# Patient Record
Sex: Female | Born: 1990 | Race: White | Hispanic: No | Marital: Single | Smoking: Never smoker
Health system: Southern US, Community
[De-identification: ages and names within clinical notes are randomized; demographics above are authoritative.]

## PROBLEM LIST (undated history)

## (undated) HISTORY — PX: OTHER SURGICAL HISTORY: SHX169

## (undated) HISTORY — PX: APPENDECTOMY: SHX54

---

## 2013-12-22 ENCOUNTER — Emergency Department (HOSPITAL_BASED_OUTPATIENT_CLINIC_OR_DEPARTMENT_OTHER)
Admission: EM | Admit: 2013-12-22 | Discharge: 2013-12-22 | Disposition: A | Discharge status: Home / Self Care | Payer: BC Managed Care – PPO | Attending: Emergency Medicine | Admitting: Emergency Medicine

## 2013-12-22 ENCOUNTER — Encounter (HOSPITAL_BASED_OUTPATIENT_CLINIC_OR_DEPARTMENT_OTHER): Payer: Self-pay | Admitting: Emergency Medicine

## 2013-12-22 ENCOUNTER — Emergency Department (HOSPITAL_BASED_OUTPATIENT_CLINIC_OR_DEPARTMENT_OTHER): Payer: BC Managed Care – PPO

## 2013-12-22 DIAGNOSIS — J029 Acute pharyngitis, unspecified: Secondary | ICD-10-CM | POA: Insufficient documentation

## 2013-12-22 DIAGNOSIS — Z792 Long term (current) use of antibiotics: Secondary | ICD-10-CM | POA: Insufficient documentation

## 2013-12-22 DIAGNOSIS — K219 Gastro-esophageal reflux disease without esophagitis: Secondary | ICD-10-CM | POA: Insufficient documentation

## 2013-12-22 DIAGNOSIS — Z79899 Other long term (current) drug therapy: Secondary | ICD-10-CM | POA: Insufficient documentation

## 2013-12-22 LAB — CBC WITH DIFFERENTIAL/PLATELET
Basophils Absolute: 0 10*3/uL (ref 0.0–0.1)
Basophils Relative: 0 % (ref 0–1)
EOS ABS: 0.1 10*3/uL (ref 0.0–0.7)
Eosinophils Relative: 2 % (ref 0–5)
HCT: 42.6 % (ref 36.0–46.0)
HEMOGLOBIN: 14.4 g/dL (ref 12.0–15.0)
Lymphocytes Relative: 20 % (ref 12–46)
Lymphs Abs: 1 10*3/uL (ref 0.7–4.0)
MCH: 29.8 pg (ref 26.0–34.0)
MCHC: 33.8 g/dL (ref 30.0–36.0)
MCV: 88.2 fL (ref 78.0–100.0)
MONO ABS: 0.3 10*3/uL (ref 0.1–1.0)
Monocytes Relative: 6 % (ref 3–12)
Neutro Abs: 3.6 10*3/uL (ref 1.7–7.7)
Neutrophils Relative %: 72 % (ref 43–77)
PLATELETS: 327 10*3/uL (ref 150–400)
RBC: 4.83 MIL/uL (ref 3.87–5.11)
RDW: 13.1 % (ref 11.5–15.5)
WBC: 5.1 10*3/uL (ref 4.0–10.5)

## 2013-12-22 LAB — COMPREHENSIVE METABOLIC PANEL
ALT: 17 U/L (ref 0–35)
ANION GAP: 11 (ref 5–15)
AST: 22 U/L (ref 0–37)
Albumin: 3.9 g/dL (ref 3.5–5.2)
Alkaline Phosphatase: 38 U/L — ABNORMAL LOW (ref 39–117)
BUN: 12 mg/dL (ref 6–23)
CO2: 26 mEq/L (ref 19–32)
CREATININE: 0.8 mg/dL (ref 0.50–1.10)
Calcium: 9.6 mg/dL (ref 8.4–10.5)
Chloride: 103 mEq/L (ref 96–112)
GFR calc Af Amer: >90 mL/min (ref >90)
Glucose, Bld: 87 mg/dL (ref 70–99)
Potassium: 4.2 mEq/L (ref 3.7–5.3)
Sodium: 140 mEq/L (ref 137–147)
TOTAL PROTEIN: 6.7 g/dL (ref 6.0–8.3)
Total Bilirubin: 2.3 mg/dL — ABNORMAL HIGH (ref 0.3–1.2)

## 2013-12-22 LAB — LIPASE, BLOOD: Lipase: 39 U/L (ref 11–59)

## 2013-12-22 MED ORDER — GI COCKTAIL ~~LOC~~
30.0000 mL | Freq: Once | ORAL | Status: AC
  Administered 2013-12-22: 30 mL via ORAL
  Filled 2013-12-22: qty 30

## 2013-12-22 NOTE — ED Notes (Signed)
Pt c/o a gurgling sensation in her throat "as if vomit is sitting there". Pt also c/o heartburn. Pt denies difficulty swallowing.

## 2013-12-22 NOTE — ED Provider Notes (Signed)
Medical screening examination/treatment/procedure(s) were performed by non-physician practitioner and as supervising physician I was immediately available for consultation/collaboration.     Rebbecca Osuna, MD 12/22/13 1503 

## 2013-12-22 NOTE — ED Provider Notes (Signed)
CSN: [id number]     Arrival date & time 12/22/13  1115 History   First MD Initiated Contact with Patient 12/22/13 1126     Chief Complaint  Patient presents with  . Sore Throat     (Consider location/radiation/quality/duration/timing/severity/associated sxs/prior Treatment) HPI Comments: Pt states that for the last several days she has had a gurgling in her throat with some vomiting. Pt states that she also has a burning sensation in her chest. Pt states that she is not having fever and diarrhea. Pt states that she is also have ruq pain. Was seen 2 days ago and put on cipro and prilosec  The history is provided by the patient. No language interpreter was used.    History reviewed. No pertinent past medical history. Past Surgical History  Procedure Laterality Date  . Appendectomy    . Cyst removal from foot     No family history on file. History  Substance Use Topics  . Smoking status: Never Smoker   . Smokeless tobacco: Not on file  . Alcohol Use: Yes   OB History   Grav Para Term Preterm Abortions TAB SAB Ect Mult Living                 Review of Systems  Constitutional: Negative.   Respiratory: Negative.   Cardiovascular: Negative.       Allergies  Review of patient's allergies indicates no known allergies.  Home Medications   Prior to Admission medications   Medication Sig Start Date End Date Taking? Authorizing Provider  ciprofloxacin (CIPRO) 100 MG tablet Take 100 mg by mouth 2 (two) times daily.   Yes Historical Provider, MD  omeprazole (PRILOSEC) 20 MG capsule Take 20 mg by mouth daily.   Yes Historical Provider, MD   BP 115/72  Pulse 102  Temp(Src) 98.5 F (36.9 C) (Oral)  Resp 16  Ht 5' 3.5" (1.613 m)  Wt 94 lb (42.638 kg)  BMI 16.39 kg/m2  SpO2 100%  LMP 12/19/2013 Physical Exam  Nursing note and vitals reviewed. Constitutional: She is oriented to person, place, and time. She appears well-developed and well-nourished.  HENT:  Head:  Normocephalic.  Right Ear: External ear normal.  Left Ear: External ear normal.  Mouth/Throat: Oropharynx is clear and moist.  Cardiovascular: Normal rate and regular rhythm.   Pulmonary/Chest: Effort normal and breath sounds normal.  Abdominal: Soft. Bowel sounds are normal.  ruq tenderness  Musculoskeletal: Normal range of motion.  Neurological: She is alert and oriented to person, place, and time. Coordination normal.  Skin: Skin is warm and dry.  Psychiatric: She has a normal mood and affect.    ED Course  Procedures (including critical care time) Labs Review Labs Reviewed  COMPREHENSIVE METABOLIC PANEL - Abnormal; Notable for the following:    Alkaline Phosphatase 38 (*)    Total Bilirubin 2.3 (*)    All other components within normal limits  CBC WITH DIFFERENTIAL  LIPASE, BLOOD    Imaging Review US Abdomen Complete  12/22/2013   CLINICAL DATA:  Sore throat and heartburn.  EXAM: ULTRASOUND ABDOMEN COMPLETE  COMPARISON:  None.  FINDINGS: Gallbladder:  No gallstones or wall thickening visualized. No sonographic Murphy sign noted.  Common bile duct:  Diameter: 2 mm, within normal limits.  Liver:  No focal lesion identified. Within normal limits in parenchymal echogenicity.  IVC:  Visualization is limited by bowel gas.  Pancreas:  Visualization is limited by bowel gas.  Spleen:  6.6 cm, negative.  Right  Kidney:  Length: 10.3 cm. Parenchymal echogenicity is within normal limits. No hydronephrosis. No focal lesion.  Left Kidney:  Length: 9.0 cm. Parenchymal echogenicity is within normal limits. No hydronephrosis. No focal lesion.  Abdominal aorta:  Visualization is limited by bowel gas.  No definite aneurysm.  Other findings:  None.  IMPRESSION: 1. Exam is somewhat limited by bowel gas. 2. No acute findings.   Electronically Signed   By: Leanna Battles M.D.   On: 12/22/2013 12:59     EKG Interpretation None      MDM   Final diagnoses:  Gastroesophageal reflux disease,  esophagitis presence not specified    No acute findings today. Pt okay to continue prilosec.    Teressa Lower, NP 12/22/13 1309

## 2013-12-22 NOTE — Discharge Instructions (Signed)
Follow up for continued symptoms Heartburn Heartburn is a painful, burning sensation in the chest. It may feel worse in certain positions, such as lying down or bending over. It is caused by stomach acid backing up into the tube that carries food from the mouth down to the stomach (lower esophagus).  CAUSES   Large meals.  Certain foods and drinks.  Exercise.  Increased acid production.  Being overweight or obese.  Certain medicines. SYMPTOMS   Burning pain in the chest or lower throat.  Bitter taste in the mouth.  Coughing. DIAGNOSIS  If the usual treatments for heartburn do not improve your symptoms, then tests may be done to see if there is another condition present. Possible tests may include:  X-rays.  Endoscopy. This is when a tube with a light and a camera on the end is used to examine the esophagus and the stomach.  A test to measure the amount of acid in the esophagus (pH test).  A test to see if the esophagus is working properly (esophageal manometry).  Blood, breath, or stool tests to check for bacteria that cause ulcers. TREATMENT   Your caregiver may tell you to use certain over-the-counter medicines (antacids, acid reducers) for mild heartburn.  Your caregiver may prescribe medicines to decrease the acid in your stomach or protect your stomach lining.  Your caregiver may recommend certain diet changes.  For severe cases, your caregiver may recommend that the head of your bed be elevated on blocks. (Sleeping with more pillows is not an effective treatment as it only changes the position of your head and does not improve the main problem of stomach acid refluxing into the esophagus.) HOME CARE INSTRUCTIONS   Take all medicines as directed by your caregiver.  Raise the head of your bed by putting blocks under the legs if instructed to by your caregiver.  Do not exercise right after eating.  Avoid eating 2 or 3 hours before bed. Do not lie down right  after eating.  Eat small meals throughout the day instead of 3 large meals.  Stop smoking if you smoke.  Maintain a healthy weight.  Identify foods and beverages that make your symptoms worse and avoid them. Foods you may want to avoid include:  Peppers.  Chocolate.  High-fat foods, including fried foods.  Spicy foods.  Garlic and onions.  Citrus fruits, including oranges, grapefruit, lemons, and limes.  Food containing tomatoes or tomato products.  Mint.  Carbonated drinks, caffeinated drinks, and alcohol.  Vinegar. SEEK IMMEDIATE MEDICAL CARE IF:  You have severe chest pain that goes down your arm or into your jaw or neck.  You feel sweaty, dizzy, or lightheaded.  You are short of breath.  You vomit blood.  You have difficulty or pain with swallowing.  You have bloody or black, tarry stools.  You have episodes of heartburn more than 3 times a week for more than 2 weeks. MAKE SURE YOU:  Understand these instructions.  Will watch your condition.  Will get help right away if you are not doing well or get worse. Document Released: 08/20/2008 Document Revised: 06/26/2011 Document Reviewed: 09/18/2010 [facility] Patient Information 2015 [place], [place]. This information is not intended to replace advice given to you by your health care provider. Make sure you discuss any questions you have with your health care provider.

## 2015-08-08 ENCOUNTER — Emergency Department (HOSPITAL_BASED_OUTPATIENT_CLINIC_OR_DEPARTMENT_OTHER)
Admission: EM | Admit: 2015-08-08 | Discharge: 2015-08-08 | Disposition: A | Discharge status: Home / Self Care | Payer: BLUE CROSS/BLUE SHIELD | Attending: Emergency Medicine | Admitting: Emergency Medicine

## 2015-08-08 ENCOUNTER — Emergency Department (HOSPITAL_BASED_OUTPATIENT_CLINIC_OR_DEPARTMENT_OTHER): Payer: BLUE CROSS/BLUE SHIELD

## 2015-08-08 ENCOUNTER — Encounter (HOSPITAL_BASED_OUTPATIENT_CLINIC_OR_DEPARTMENT_OTHER): Payer: Self-pay | Admitting: *Deleted

## 2015-08-08 DIAGNOSIS — Y9389 Activity, other specified: Secondary | ICD-10-CM | POA: Diagnosis not present

## 2015-08-08 DIAGNOSIS — S91201A Unspecified open wound of right great toe with damage to nail, initial encounter: Secondary | ICD-10-CM | POA: Diagnosis not present

## 2015-08-08 DIAGNOSIS — X58XXXA Exposure to other specified factors, initial encounter: Secondary | ICD-10-CM | POA: Diagnosis not present

## 2015-08-08 DIAGNOSIS — Z79899 Other long term (current) drug therapy: Secondary | ICD-10-CM | POA: Diagnosis not present

## 2015-08-08 DIAGNOSIS — Y998 Other external cause status: Secondary | ICD-10-CM | POA: Diagnosis not present

## 2015-08-08 DIAGNOSIS — Y9289 Other specified places as the place of occurrence of the external cause: Secondary | ICD-10-CM | POA: Insufficient documentation

## 2015-08-08 DIAGNOSIS — IMO0002 Reserved for concepts with insufficient information to code with codable children: Secondary | ICD-10-CM

## 2015-08-08 DIAGNOSIS — S99921A Unspecified injury of right foot, initial encounter: Secondary | ICD-10-CM | POA: Diagnosis present

## 2015-08-08 MED ORDER — LIDOCAINE HCL 2 % IJ SOLN
20.0000 mL | Freq: Once | INTRAMUSCULAR | Status: AC
Start: 2015-08-08 — End: 2015-08-08
  Administered 2015-08-08: 400 mg
  Filled 2015-08-08: qty 20

## 2015-08-08 NOTE — ED Notes (Signed)
Pt given d/c instructions as per chart. Verbalizes understanding. No questions. 

## 2015-08-08 NOTE — ED Notes (Signed)
pts right great toe nail was pulled back.  Slight bleeding noted.  Pt ambulatory.

## 2015-08-08 NOTE — ED Provider Notes (Signed)
CSN: [id number]     Arrival date & time 08/08/15  1608 History   First MD Initiated Contact with Patient 08/08/15 1637     Chief Complaint  Patient presents with  . Toe Injury   HPI   25 year old female presents today with toe injury. Patient reports that last week she stubbed her toe causing pain to the right great toe. She reports that today she was wearing sandals and pulled the door back pulling up her nail from the nailbed. She reports minor amount of bleeding which has stopped prior to my arrival. She denies any decreased range of motion or sensation in the toe, no other injuries noted.  History reviewed. No pertinent past medical history. Past Surgical History  Procedure Laterality Date  . Appendectomy    . Cyst removal from foot     History reviewed. No pertinent family history. Social History  Substance Use Topics  . Smoking status: Never Smoker   . Smokeless tobacco: None  . Alcohol Use: Yes   OB History    No data available     Review of Systems  All other systems reviewed and are negative.     Allergies  Review of patient's allergies indicates no known allergies.  Home Medications   Prior to Admission medications   Medication Sig Start Date End Date Taking? Authorizing Provider  SUMAtriptan (IMITREX) 50 MG tablet Take 50 mg by mouth every 2 (two) hours as needed for migraine. May repeat in 2 hours if headache persists or recurs.   Yes Historical Provider, MD  omeprazole (PRILOSEC) 20 MG capsule Take 20 mg by mouth daily.    Historical Provider, MD   BP 118/70 mmHg  Pulse 88  Temp(Src) 98.7 F (37.1 C) (Oral)  Resp 18  Ht 5\' 4"  (1.626 m)  Wt 42.638 kg  BMI 16.13 kg/m2  SpO2 100%  LMP 07/25/2015 Physical Exam  Constitutional: She is oriented to person, place, and time. She appears well-developed and well-nourished.  HENT:  Head: Normocephalic and atraumatic.  Eyes: Conjunctivae are normal. Pupils are equal, round, and reactive to light. Right eye  exhibits no discharge. Left eye exhibits no discharge. No scleral icterus.  Neck: Normal range of motion. No JVD present. No tracheal deviation present.  Pulmonary/Chest: Effort normal. No stridor.  Musculoskeletal:  Right great toenail avulsed from the nailbed, intact with no nail bed injury noted, Refill less than 3 seconds  Neurological: She is alert and oriented to person, place, and time. Coordination normal.  Psychiatric: She has a normal mood and affect. Her behavior is normal. Judgment and thought content normal.  Nursing note and vitals reviewed.   ED Course  Procedures (including critical care time)  NERVE BLOCK Performed by: Thermon LeylandHedges,Orbie Grupe Todd Consent: Verbal consent obtained. Required items: required blood products, implants, devices, and special equipment available Time out: Immediately prior to procedure a "time out" was called to verify the correct patient, procedure, equipment, support staff and site/side marked as required.  Indication: Toe injury  Nerve block body site: Right great toe   Preparation: Patient was prepped and draped in the usual sterile fashion. Needle gauge: 24 G Location technique: anatomical landmarks  Local anesthetic: 2% lidocaine   Anesthetic total: 3 ML's ml  Outcome: pain improved Patient tolerance: Patient tolerated the procedure well with no immediate complications. Labs Review Labs Reviewed - No data to display  Imaging Review Dg Toe Great Right  08/08/2015  CLINICAL DATA:  25 year old with great toe pain and swelling  following injury today. Previous injury 1 or 2 weeks ago. EXAM: RIGHT GREAT TOE COMPARISON:  None. FINDINGS: The mineralization and alignment are normal. There is no evidence of acute fracture or dislocation. The joint spaces are maintained. No evidence of foreign body or soft tissue emphysema. IMPRESSION: No acute osseous findings. Electronically Signed   By: Carey Bullocks M.D.   On: 08/08/2015 17:19   I have  personally reviewed and evaluated these images and lab results as part of my medical decision-making.   EKG Interpretation None      MDM   Final diagnoses:  Nail avulsion    Labs:  Imaging:  Consults:  Therapeutics:  Discharge Meds:   Assessment/Plan:Patient presents with nail avulsion. No nail bed injury, no broken bones noted. Digital block performed, nailbed was cleaned and nail was placed down on the nailbed. Patient instructed follow-up with her podiatrist for reevaluation of symptoms worsen or she has any acute problems. Patient is given strict return precautions the event infection, she verbalized understanding and agreement today's plan.        Eyvonne Mechanic, PA-C 08/08/15 1859  Nelva Nay, MD 08/08/15 [phone]

## 2015-08-08 NOTE — ED Notes (Signed)
Patient transported to X-ray 

## 2016-10-12 IMAGING — DX DG TOE GREAT 2+V*R*
3 series · 3 of 3 positions shown · non-contrast
Comparison: None.

CLINICAL DATA: 24-year-old with great toe pain and swelling
following injury today. Previous injury 1 or 2 weeks ago.

EXAM:
RIGHT GREAT TOE

[toe ap]
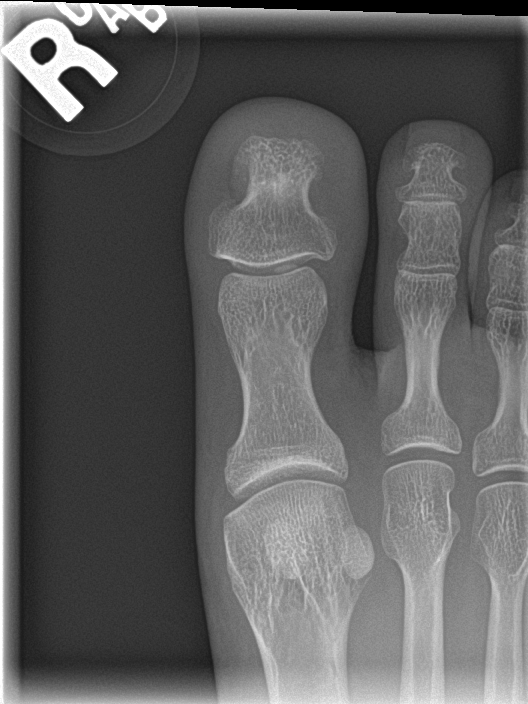

[toe obl]
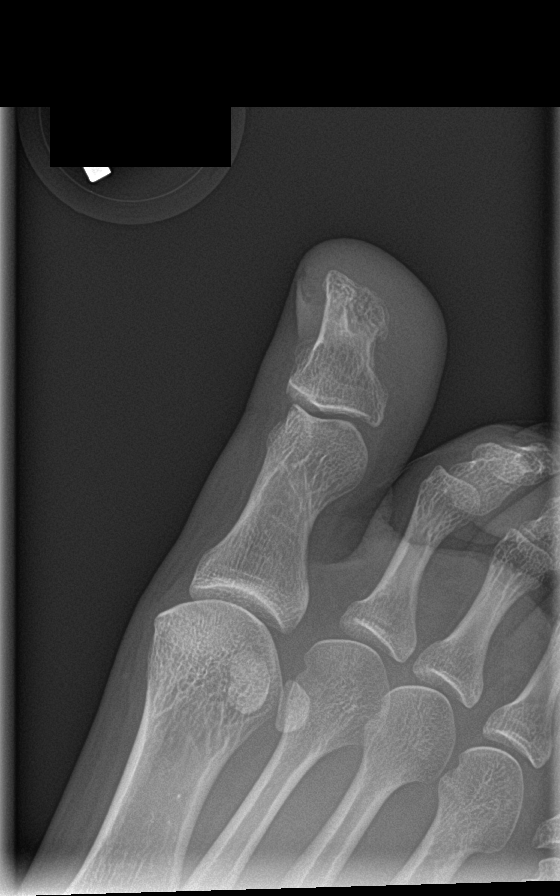

[toe lat]
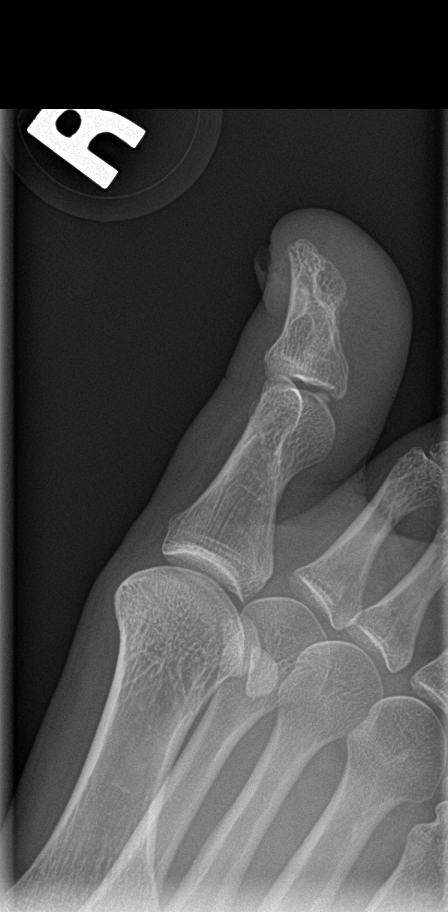

[3 of 3 positions shown; findings below may reference images not displayed]

FINDINGS: The mineralization and alignment are normal. There is no evidence of
acute fracture or dislocation. The joint spaces are maintained. No
evidence of foreign body or soft tissue emphysema.
IMPRESSION: No acute osseous findings.
